# Patient Record
Sex: Female | Born: 1970 | Race: White | Hispanic: No | Marital: Married | Smoking: Never smoker
Health system: Southern US, Community
[De-identification: ages and names within clinical notes are randomized; demographics above are authoritative.]

---

## 2001-12-27 ENCOUNTER — Other Ambulatory Visit: Admission: RE | Admit: 2001-12-27 | Discharge: 2001-12-27 | Payer: Self-pay | Admitting: Obstetrics & Gynecology

## 2002-01-13 ENCOUNTER — Encounter: Admission: RE | Admit: 2002-01-13 | Discharge: 2002-01-13 | Payer: Self-pay | Admitting: Obstetrics & Gynecology

## 2002-01-13 ENCOUNTER — Encounter: Payer: Self-pay | Admitting: Obstetrics & Gynecology

## 2002-06-13 ENCOUNTER — Ambulatory Visit (HOSPITAL_COMMUNITY): Admission: RE | Admit: 2002-06-13 | Discharge: 2002-06-13 | Payer: Self-pay | Admitting: Obstetrics & Gynecology

## 2002-08-10 ENCOUNTER — Inpatient Hospital Stay (HOSPITAL_COMMUNITY): Admission: AD | Admit: 2002-08-10 | Discharge: 2002-08-13 | Payer: Self-pay | Admitting: Obstetrics & Gynecology

## 2003-03-28 ENCOUNTER — Other Ambulatory Visit: Admission: RE | Admit: 2003-03-28 | Discharge: 2003-03-28 | Payer: Self-pay | Admitting: Obstetrics & Gynecology

## 2003-04-04 ENCOUNTER — Encounter: Admission: RE | Admit: 2003-04-04 | Discharge: 2003-04-04 | Payer: Self-pay | Admitting: Obstetrics & Gynecology

## 2004-05-02 ENCOUNTER — Other Ambulatory Visit: Admission: RE | Admit: 2004-05-02 | Discharge: 2004-05-02 | Payer: Self-pay | Admitting: Obstetrics & Gynecology

## 2005-06-10 ENCOUNTER — Other Ambulatory Visit: Admission: RE | Admit: 2005-06-10 | Discharge: 2005-06-10 | Payer: Self-pay | Admitting: Obstetrics & Gynecology

## 2010-11-26 ENCOUNTER — Other Ambulatory Visit: Payer: Self-pay | Admitting: Obstetrics & Gynecology

## 2012-12-07 ENCOUNTER — Other Ambulatory Visit: Payer: Self-pay | Admitting: Obstetrics & Gynecology

## 2012-12-07 DIAGNOSIS — R928 Other abnormal and inconclusive findings on diagnostic imaging of breast: Secondary | ICD-10-CM

## 2012-12-19 ENCOUNTER — Ambulatory Visit
Admission: RE | Admit: 2012-12-19 | Discharge: 2012-12-19 | Disposition: A | Discharge status: Home / Self Care | Payer: BC Managed Care – PPO | Source: Ambulatory Visit | Attending: Obstetrics & Gynecology | Admitting: Obstetrics & Gynecology

## 2012-12-19 DIAGNOSIS — R928 Other abnormal and inconclusive findings on diagnostic imaging of breast: Secondary | ICD-10-CM

## 2012-12-22 ENCOUNTER — Other Ambulatory Visit: Payer: Self-pay

## 2014-03-14 ENCOUNTER — Other Ambulatory Visit: Payer: Self-pay

## 2014-03-15 LAB — CYTOLOGY - PAP

## 2014-04-25 ENCOUNTER — Other Ambulatory Visit: Payer: Self-pay

## 2014-04-30 ENCOUNTER — Other Ambulatory Visit (HOSPITAL_COMMUNITY): Payer: Self-pay | Admitting: Obstetrics & Gynecology

## 2014-04-30 DIAGNOSIS — R1031 Right lower quadrant pain: Secondary | ICD-10-CM

## 2014-04-30 DIAGNOSIS — R102 Pelvic and perineal pain: Secondary | ICD-10-CM

## 2014-05-08 ENCOUNTER — Ambulatory Visit (HOSPITAL_COMMUNITY): Payer: BC Managed Care – PPO

## 2015-04-26 ENCOUNTER — Other Ambulatory Visit: Payer: Self-pay | Admitting: Obstetrics & Gynecology

## 2015-04-30 LAB — CYTOLOGY - PAP

## 2015-12-26 ENCOUNTER — Other Ambulatory Visit: Payer: Self-pay | Admitting: Obstetrics & Gynecology

## 2016-05-04 HISTORY — PX: AUGMENTATION MAMMAPLASTY: SUR837

## 2016-10-19 ENCOUNTER — Ambulatory Visit: Payer: BC Managed Care – PPO | Admitting: Podiatry

## 2016-11-09 ENCOUNTER — Encounter: Payer: Self-pay | Admitting: Podiatry

## 2016-11-09 ENCOUNTER — Ambulatory Visit (INDEPENDENT_AMBULATORY_CARE_PROVIDER_SITE_OTHER): Payer: BC Managed Care – PPO | Admitting: Podiatry

## 2016-11-09 DIAGNOSIS — B351 Tinea unguium: Secondary | ICD-10-CM

## 2016-11-11 ENCOUNTER — Telehealth: Payer: Self-pay | Admitting: *Deleted

## 2016-11-11 ENCOUNTER — Telehealth: Payer: Self-pay | Admitting: Podiatry

## 2016-11-11 MED ORDER — NONFORMULARY OR COMPOUNDED ITEM: 2 refills | Status: AC

## 2016-11-11 NOTE — Telephone Encounter (Signed)
Domingo SepB. Bonkemeyer states pt said she has called multiple times today for a prescription from Dr. Logan BoresEvans [place]. I spoke with Dr. Logan BoresEvans and he ordered [facility] Onych[facility]osis N[facility] Lacquer. [facility]. I called pt to inform orders had been faxed and [facility] [phone]. I informed pt of the [facility] orders, they would call her to discuss insurance coverage and delivery. I gave her the [facility] [phone] to be aware of for their call.

## 2016-11-11 NOTE — Telephone Encounter (Signed)
Pt called very upset, she was seen on Monday and was told [facility] [place] [facility] [place] [facility] and she called them today and they did not have anything. She said she paid [phone] for the office visit and was told the script would be sent Monday afternoon.

## 2016-11-27 NOTE — Progress Notes (Signed)
   Subjective: Patient presents today as a new patient for evaluation of a discolored and thickened left great toenail. Patient states that is been like this for several months. The patient is not interested in taking oral Lamisil or any oral antifungal medication. Patient denies trauma. Patient presents today for further treatment and evaluation  Objective: Physical Exam General: The patient is alert and oriented x3 in no acute distress.  Dermatology: Hyperkeratotic, discolored, thickened, onychodystrophy of nails noted bilaterally.  Skin is warm, dry and supple bilateral lower extremities. Negative for open lesions or macerations.  Vascular: Palpable pedal pulses bilaterally. No edema or erythema noted. Capillary refill within normal limits.  Neurological: Epicritic and protective threshold grossly intact bilaterally.   Musculoskeletal Exam: Range of motion within normal limits to all pedal and ankle joints bilateral. Muscle strength 5/5 in all groups bilateral.   Assessment: #1 onychodystrophy with possible fungal nail infection left great toe  Plan of Care:  #1 Patient was evaluated. Today nail biopsy was performed of the left great toenail. #2 today discussed additional antifungal treatment options other than oral medication. Today the patient opts for antifungal nail lacquer which were going to [facility] #3 if the nails do not improve with time recommend that the patient return for appointment for antifungal nail laser treatment. #4 return to clinic when necessary  Felecia ShellingBrent M. Graeden Bitner, DPM [facility]  Dr. Felecia ShellingBrent M. Victorina Kable, DPM    [address]                                        [place], [place] [address]                Office [phone]  Fax [phone]

## 2017-02-10 NOTE — Telephone Encounter (Signed)
Entered in error

## 2017-10-18 ENCOUNTER — Telehealth: Payer: Self-pay | Admitting: Podiatry

## 2017-10-18 ENCOUNTER — Telehealth: Payer: Self-pay | Admitting: *Deleted

## 2017-10-18 NOTE — Telephone Encounter (Signed)
I was seen there last year and the doctor took a sample of my toenail and I never got my results from that. Now I'm seeing another doctor tomorrow and they are wanting those results to review. Since I waited such a long time and never got the results I would like to know if I can get them ASAP? My number is [phone]. If you want I can just come get the results if it is a problem for you to e-mail it to me. I don't understand why I never heard from you about the results and I'm very disappointed. So, I don't want to make a big deal out of it. I just the results because now I'm going to see another doctor. Again my number is [phone]. I would like to speak to the office manager about this. Just give me a call. Thank you.

## 2017-10-18 NOTE — Telephone Encounter (Signed)
Unable to leave a message on home phone answering service states memory is full. Left message on pt's mobile phone the requested results and be emailed this morning.

## 2017-10-18 NOTE — Telephone Encounter (Signed)
I e-mailed the patient her requested labs.

## 2017-10-18 NOTE — Telephone Encounter (Signed)
Emailed by records coordinatior - J. Smith.

## 2017-10-18 NOTE — Telephone Encounter (Signed)
Fortino Sic. Munson - clinical manager states pt is requesting copy of the 11/09/2016 test results, email to [email or web address].

## 2017-11-25 ENCOUNTER — Other Ambulatory Visit: Payer: Self-pay | Admitting: Obstetrics & Gynecology

## 2017-11-25 DIAGNOSIS — R928 Other abnormal and inconclusive findings on diagnostic imaging of breast: Secondary | ICD-10-CM

## 2017-12-01 ENCOUNTER — Ambulatory Visit
Admission: RE | Admit: 2017-12-01 | Discharge: 2017-12-01 | Disposition: A | Discharge status: Home / Self Care | Payer: BC Managed Care – PPO | Source: Ambulatory Visit | Attending: Obstetrics & Gynecology | Admitting: Obstetrics & Gynecology

## 2017-12-01 DIAGNOSIS — R928 Other abnormal and inconclusive findings on diagnostic imaging of breast: Secondary | ICD-10-CM

## 2019-04-05 ENCOUNTER — Other Ambulatory Visit: Payer: Self-pay | Admitting: Family Medicine

## 2019-04-05 DIAGNOSIS — R1011 Right upper quadrant pain: Secondary | ICD-10-CM

## 2019-04-21 ENCOUNTER — Other Ambulatory Visit: Payer: BC Managed Care – PPO

## 2019-07-01 ENCOUNTER — Ambulatory Visit: Payer: BC Managed Care – PPO | Attending: Internal Medicine

## 2019-07-01 DIAGNOSIS — Z23 Encounter for immunization: Secondary | ICD-10-CM | POA: Insufficient documentation

## 2019-07-01 NOTE — Progress Notes (Signed)
   Covid-19 Vaccination Clinic  Name:  Lesha Jager    MRN: [id number] DOB: [birth year]  07/01/2019  Ms. Basulto was observed post Covid-19 immunization for 15 minutes without incidence. She was provided with Vaccine Information Sheet and instruction to access the V-Safe system.   Ms. Blankenburg was instructed to call 911 with any severe reactions post vaccine: Marland Kitchen Difficulty breathing  . Swelling of your face and throat  . A fast heartbeat  . A bad rash all over your body  . Dizziness and weakness    Immunizations Administered    Name Date Dose VIS Date Route   Pfizer COVID-19 Vaccine 07/01/2019  2:46 PM 0.3 mL 04/14/2019 Intramuscular   Manufacturer: [employer], [employer]   Lot: [id number]   NDC: [address]-4

## 2019-07-22 ENCOUNTER — Ambulatory Visit: Payer: BC Managed Care – PPO | Attending: Internal Medicine

## 2019-07-22 DIAGNOSIS — Z23 Encounter for immunization: Secondary | ICD-10-CM

## 2019-07-22 NOTE — Progress Notes (Signed)
   Covid-19 Vaccination Clinic  Name:  Traci Ford    MRN: [id number] DOB: [birth year]  07/22/2019  Traci Ford was observed post Covid-19 immunization for 15 minutes without incident. She was provided with Vaccine Information Sheet and instruction to access the V-Safe system.   Traci Ford was instructed to call 911 with any severe reactions post vaccine: Marland Kitchen Difficulty breathing  . Swelling of face and throat  . A fast heartbeat  . A bad rash all over body  . Dizziness and weakness   Immunizations Administered    Name Date Dose VIS Date Route   Pfizer COVID-19 Vaccine 07/22/2019 12:50 PM 0.3 mL 04/14/2019 Intramuscular   Manufacturer: [employer], [employer]   Lot: [id number]   NDC: [address]-4

## 2020-03-01 ENCOUNTER — Encounter: Payer: Self-pay | Admitting: Neurology

## 2020-06-10 ENCOUNTER — Ambulatory Visit: Payer: BC Managed Care – PPO | Admitting: Neurology

## 2020-06-17 ENCOUNTER — Encounter: Payer: Self-pay | Admitting: Neurology

## 2020-06-17 ENCOUNTER — Ambulatory Visit: Payer: BC Managed Care – PPO | Admitting: Neurology

## 2020-06-17 ENCOUNTER — Other Ambulatory Visit: Payer: Self-pay

## 2020-06-17 VITALS — BP 120/74 | HR 63 | Ht 68.0 in | Wt 151.0 lb

## 2020-06-17 DIAGNOSIS — M79671 Pain in right foot: Secondary | ICD-10-CM

## 2020-06-17 DIAGNOSIS — M79672 Pain in left foot: Secondary | ICD-10-CM

## 2020-06-17 DIAGNOSIS — M5417 Radiculopathy, lumbosacral region: Secondary | ICD-10-CM

## 2020-06-17 NOTE — Patient Instructions (Signed)
Start physical therapy for low back strengthening   Return to clinic in 3 months

## 2020-06-17 NOTE — Progress Notes (Signed)
[facility] Neurology [facility] Clinic Note - Initial Visit   Date: 06/17/20  Traci Ford MRN: [id number] DOB: December 01, 1970   Dear Dr. Kevan Ny:  Thank you for your kind referral of Traci Ford for consultation of bilateral feet numbness. Although her history is well known to you, please allow [place] to reiterate it for the purpose of our medical record. The patient was accompanied to the clinic by self.    History of Present Illness: Traci Ford is a 50 y.o. right-handed female  presenting for evaluation of bilateral feet numbness.  She is very active and competitively lifts weights.  She is an [employer] manager.  Starting about 1 year ago, she began having numbness in the involving the soles of the feet, which is worse with activity, such as weight lifting, and alleviated by rest.  She denies weakness or low back pain.  Symptoms were occurring daily, but she started getting soft tissue massage for sciatica and has marked improvement.  Now, she has numbness at much less intensity.  She also complains of achy pain upon standing in the morning.  It self-resolved within a few minutes.      Past Surgical History:  Procedure Laterality Date  . AUGMENTATION MAMMAPLASTY Bilateral 2018     Medications:  Outpatient Encounter Medications as of 06/17/2020  Medication Sig  . escitalopram (LEXAPRO) 10 MG tablet Take 10 mg by mouth daily.  . Multiple Vitamins-Minerals (MULTIVITAMIN ADULT EXTRA C PO) multivitamin  . spironolactone (ALDACTONE) 100 MG tablet Take 100 mg by mouth daily.  . NONFORMULARY OR COMPOUNDED ITEM [facility]:  Onychomycosis Nail Lacquer - Fluconazole 2%, Terbinafine 1%, DMSO apply to affected area daily. (Patient not taking: Reported on 06/17/2020)   No facility-administered encounter medications on file as of 06/17/2020.    Allergies: No Known Allergies  Family History: Family History  Problem Relation Age of Onset  . Breast cancer Neg Hx     Social  History: Social History   Tobacco Use  . Smoking status: Never Smoker  . Smokeless tobacco: Never Used  Vaping Use  . Vaping Use: Never used  Substance Use Topics  . Alcohol use: Yes    Comment: Social Drinking  . Drug use: Never   Social History   Social History Narrative   Right handed   Two story home    Drinks caffeine     Vital Signs:  BP 120/74   Pulse 63   Ht 5\' 8"  (1.727 m)   Wt 151 lb (68.5 kg)   SpO2 97%   BMI 22.96 kg/m    Neurological Exam: MENTAL STATUS including orientation to time, place, person, recent and remote memory, attention span and concentration, language, and fund of knowledge is normal.  Speech is not dysarthric.  CRANIAL NERVES: II:  No visual field defects.  Unremarkable fundi.   III-IV-VI: Pupils equal round and reactive to light.  Normal conjugate, extra-ocular eye movements in all directions of gaze.  No nystagmus.  No ptosis.   V:  Normal facial sensation.    VII:  Normal facial symmetry and movements.   VIII:  Normal hearing and vestibular function.   IX-X:  Normal palatal movement.   XI:  Normal shoulder shrug and head rotation.   XII:  Normal tongue strength and range of motion, no deviation or fasciculation.  MOTOR:  No atrophy, fasciculations or abnormal movements.  No pronator drift.   Upper Extremity:  Right  Left  Deltoid  5/5   5/5  Biceps  5/5   5/5   Triceps  5/5   5/5   Infraspinatus 5/5  5/5  Medial pectoralis 5/5  5/5  Wrist extensors  5/5   5/5   Wrist flexors  5/5   5/5   Finger extensors  5/5   5/5   Finger flexors  5/5   5/5   Dorsal interossei  5/5   5/5   Abductor pollicis  5/5   5/5   Tone (Ashworth scale)  0  0   Lower Extremity:  Right  Left  Hip flexors  5/5   5/5   Hip extensors  5/5   5/5   Adductor 5/5  5/5  Abductor 5/5  5/5  Knee flexors  5/5   5/5   Knee extensors  5/5   5/5   Dorsiflexors  5/5   5/5   Plantarflexors  5/5   5/5   Toe extensors  5/5   5/5   Toe flexors  5/5   5/5   Tone  (Ashworth scale)  0  0   MSRs:  Right        Left                  brachioradialis 2+  2+  biceps 2+  2+  triceps 2+  2+  patellar 2+  2+  ankle jerk 0  0  Hoffman no  no  plantar response down  down   SENSORY:  Normal and symmetric perception of light touch, pinprick, vibration, and proprioception.   COORDINATION/GAIT: Normal finger-to- nose-finger and heel-to-shin.  Intact rapid alternating movements bilaterally.  Able to rise from a chair without using arms.  Gait narrow based and stable. Tandem and stressed gait intact.    IMPRESSION: Bilateral feet numbness, likely due to S1 radiculopathy given absence of S1 reflex on exam.    - Start physical therapy for low back stretching and strengthening  Bilateral feet pain, ?plantar fasciitis  - Conservative therapies recommended  Return to clinic in 3 months.   Thank you for allowing me to participate in patient's care.  If I can answer any additional questions, I would be pleased to do so.    Sincerely,    Leanord Thibeau K. Allena Katz, DO

## 2020-06-28 ENCOUNTER — Ambulatory Visit: Payer: BC Managed Care – PPO | Admitting: Neurology

## 2020-11-25 ENCOUNTER — Other Ambulatory Visit: Payer: Self-pay | Admitting: Obstetrics

## 2020-11-25 DIAGNOSIS — Z1231 Encounter for screening mammogram for malignant neoplasm of breast: Secondary | ICD-10-CM

## 2020-11-29 ENCOUNTER — Ambulatory Visit
Admission: RE | Admit: 2020-11-29 | Discharge: 2020-11-29 | Disposition: A | Discharge status: Home / Self Care | Payer: BC Managed Care – PPO | Source: Ambulatory Visit | Attending: Obstetrics | Admitting: Obstetrics

## 2020-11-29 ENCOUNTER — Other Ambulatory Visit: Payer: Self-pay

## 2020-11-29 DIAGNOSIS — Z1231 Encounter for screening mammogram for malignant neoplasm of breast: Secondary | ICD-10-CM

## 2021-12-30 ENCOUNTER — Other Ambulatory Visit: Payer: Self-pay | Admitting: Obstetrics & Gynecology

## 2021-12-30 DIAGNOSIS — Z1231 Encounter for screening mammogram for malignant neoplasm of breast: Secondary | ICD-10-CM

## 2022-02-01 IMAGING — MG DIGITAL SCREENING BREAST BILAT IMPLANT W/ TOMO W/ CAD
8 of 12 series · 8 of 28 positions shown · non-contrast
Comparison: Previous exam(s).

CLINICAL DATA: Screening.

EXAM:
DIGITAL SCREENING BILATERAL MAMMOGRAM WITH IMPLANTS, CAD AND
TOMOSYNTHESIS
TECHNIQUE: Bilateral screening digital craniocaudal and mediolateral oblique
mammograms were obtained. Bilateral screening digital breast
tomosynthesis was performed. The images were evaluated with
computer-aided detection. Standard and/or implant displaced views
were performed.

[R MLO]
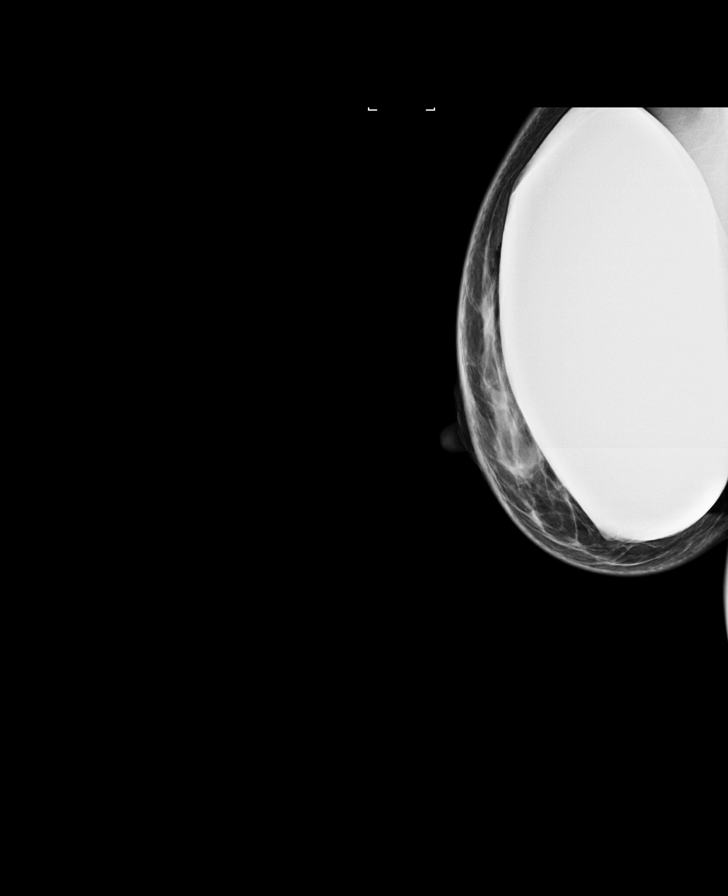

[L CC]
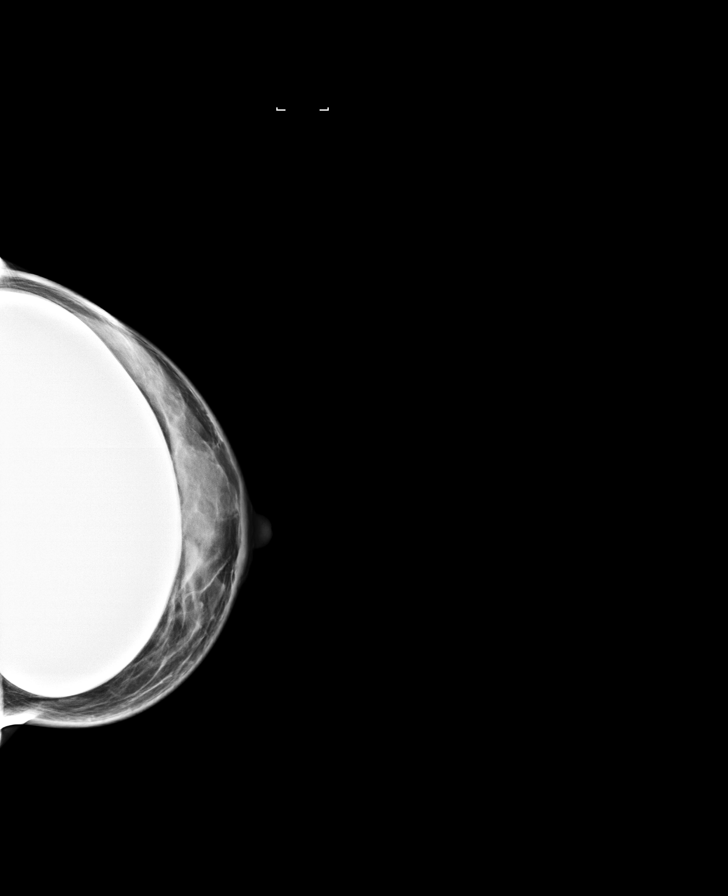

[L MLO]
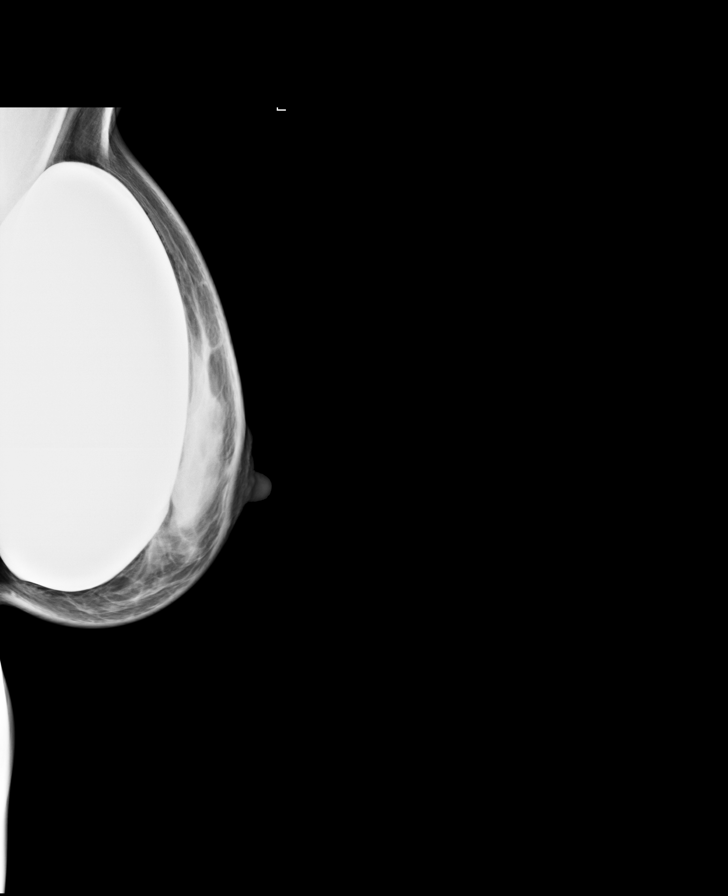

[R CC]
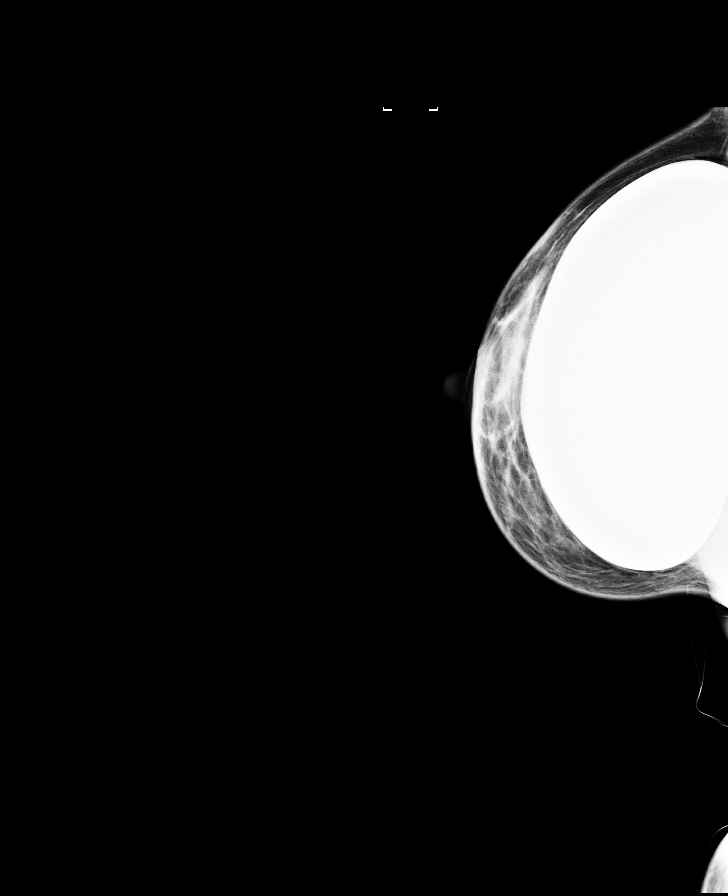

[R MLO synth-2D]
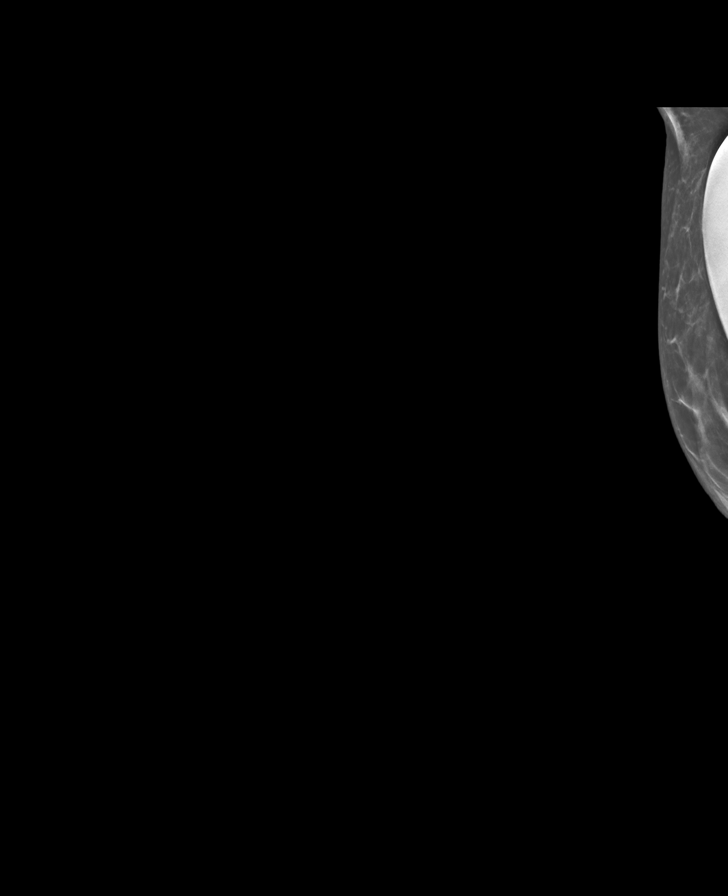

[R CC synth-2D]
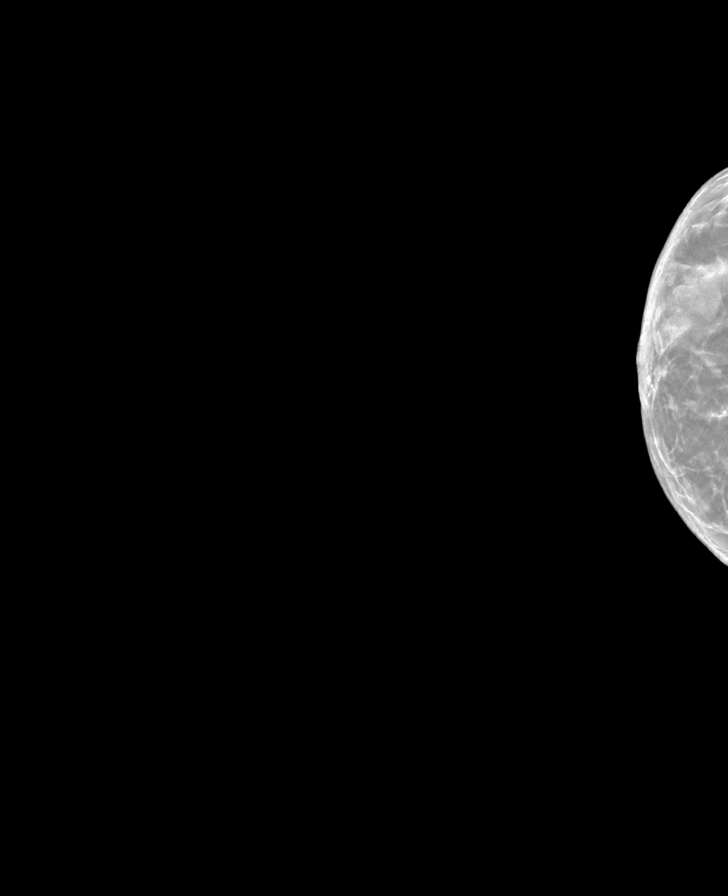

[L CC synth-2D]
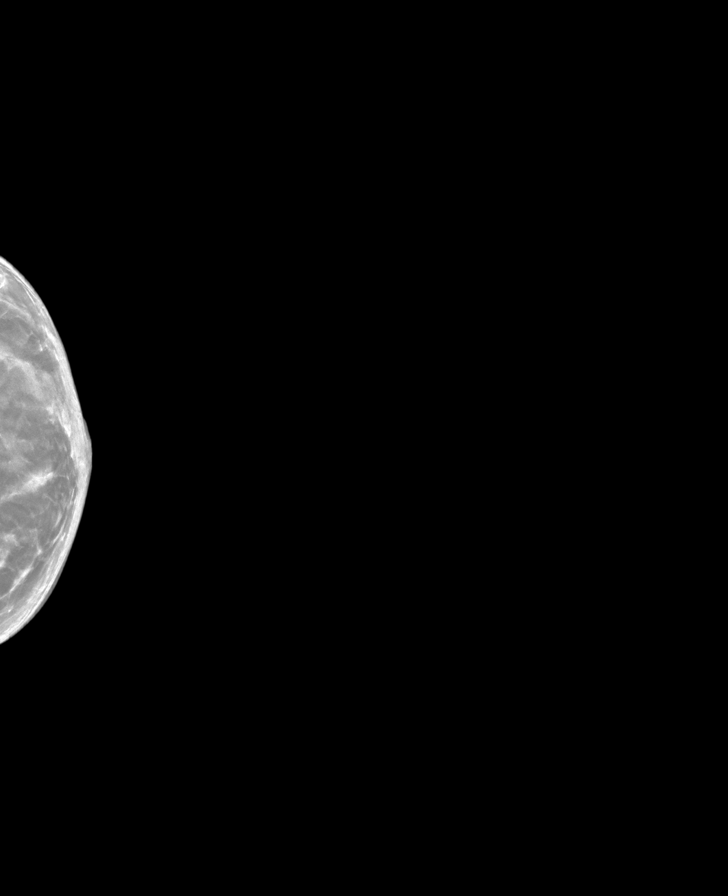

[L MLO synth-2D]
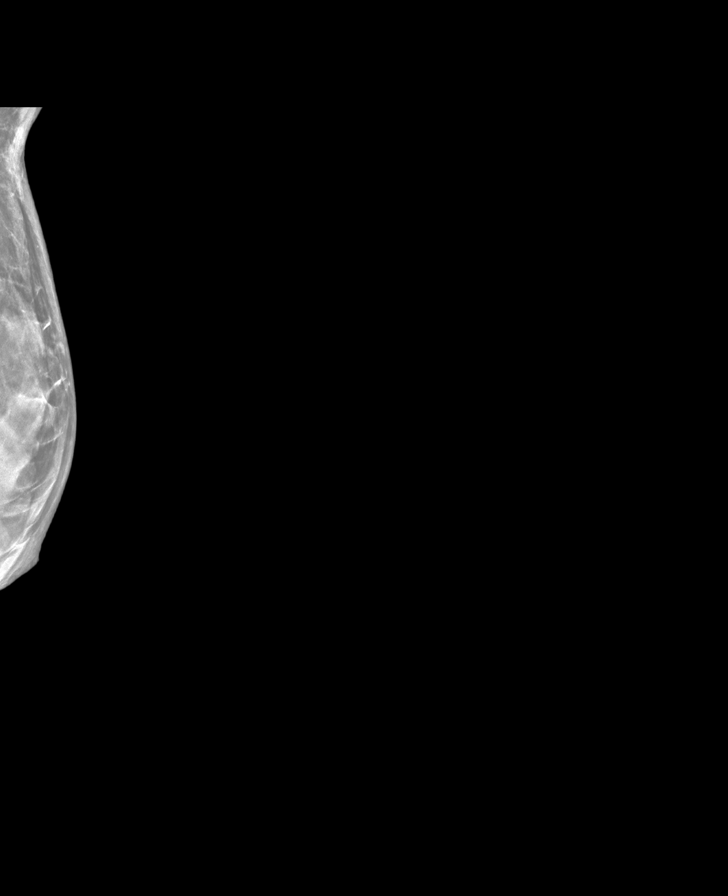

[8 of 28 positions shown; findings below may reference images not displayed]

ACR Breast Density Category c: The breast tissue is heterogeneously
dense, which may obscure small masses.
FINDINGS: The patient has implants. There are no findings suspicious for
malignancy.
IMPRESSION: No mammographic evidence of malignancy. A result letter of this
screening mammogram will be mailed directly to the patient.

RECOMMENDATION:
Screening mammogram in one year. (Code:[id number]-R-3IO)

BI-RADS CATEGORY  1:  Negative.

## 2022-02-17 ENCOUNTER — Ambulatory Visit
Admission: RE | Admit: 2022-02-17 | Discharge: 2022-02-17 | Disposition: A | Discharge status: Home / Self Care | Payer: BC Managed Care – PPO | Source: Ambulatory Visit | Attending: Obstetrics & Gynecology | Admitting: Obstetrics & Gynecology

## 2022-02-17 DIAGNOSIS — Z1231 Encounter for screening mammogram for malignant neoplasm of breast: Secondary | ICD-10-CM

## 2023-03-24 ENCOUNTER — Other Ambulatory Visit: Payer: Self-pay | Admitting: Obstetrics & Gynecology

## 2023-03-24 DIAGNOSIS — Z0000 Encounter for general adult medical examination without abnormal findings: Secondary | ICD-10-CM

## 2023-04-13 ENCOUNTER — Ambulatory Visit: Payer: BC Managed Care – PPO

## 2023-04-13 DIAGNOSIS — K641 Second degree hemorrhoids: Secondary | ICD-10-CM | POA: Diagnosis not present

## 2023-04-13 DIAGNOSIS — Z1211 Encounter for screening for malignant neoplasm of colon: Secondary | ICD-10-CM | POA: Diagnosis present

## 2023-04-22 ENCOUNTER — Ambulatory Visit
Admission: RE | Admit: 2023-04-22 | Discharge: 2023-04-22 | Disposition: A | Discharge status: Home / Self Care | Payer: BC Managed Care – PPO | Source: Ambulatory Visit | Attending: Obstetrics & Gynecology | Admitting: Obstetrics & Gynecology

## 2023-04-22 DIAGNOSIS — Z0000 Encounter for general adult medical examination without abnormal findings: Secondary | ICD-10-CM

## 2024-03-22 ENCOUNTER — Other Ambulatory Visit: Payer: Self-pay | Admitting: Obstetrics & Gynecology

## 2024-03-22 DIAGNOSIS — Z1231 Encounter for screening mammogram for malignant neoplasm of breast: Secondary | ICD-10-CM

## 2024-05-01 ENCOUNTER — Ambulatory Visit
Admission: RE | Admit: 2024-05-01 | Discharge: 2024-05-01 | Disposition: A | Source: Ambulatory Visit | Attending: Obstetrics & Gynecology | Admitting: Obstetrics & Gynecology

## 2024-05-01 DIAGNOSIS — Z1231 Encounter for screening mammogram for malignant neoplasm of breast: Secondary | ICD-10-CM
